# Patient Record
Sex: Male | Born: 1990 | Race: Asian | Hispanic: No | Marital: Single | Smoking: Never smoker
Health system: Southern US, Community
[De-identification: ages and names within clinical notes are randomized; demographics above are authoritative.]

---

## 2013-01-11 ENCOUNTER — Emergency Department (HOSPITAL_COMMUNITY)
Admission: EM | Admit: 2013-01-11 | Discharge: 2013-01-12 | Disposition: A | Discharge status: Home / Self Care | Payer: BC Managed Care – PPO | Attending: Emergency Medicine | Admitting: Emergency Medicine

## 2013-01-11 ENCOUNTER — Encounter (HOSPITAL_COMMUNITY): Payer: Self-pay | Admitting: Emergency Medicine

## 2013-01-11 DIAGNOSIS — Y9239 Other specified sports and athletic area as the place of occurrence of the external cause: Secondary | ICD-10-CM | POA: Insufficient documentation

## 2013-01-11 DIAGNOSIS — S42001A Fracture of unspecified part of right clavicle, initial encounter for closed fracture: Secondary | ICD-10-CM

## 2013-01-11 DIAGNOSIS — S42009A Fracture of unspecified part of unspecified clavicle, initial encounter for closed fracture: Secondary | ICD-10-CM | POA: Insufficient documentation

## 2013-01-11 DIAGNOSIS — R296 Repeated falls: Secondary | ICD-10-CM | POA: Insufficient documentation

## 2013-01-11 DIAGNOSIS — Y9361 Activity, american tackle football: Secondary | ICD-10-CM | POA: Insufficient documentation

## 2013-01-11 NOTE — ED Notes (Signed)
Patient states he was playing football and landed on right shoulder, now with pain and unable to move shoulder.

## 2013-01-12 ENCOUNTER — Emergency Department (HOSPITAL_COMMUNITY): Payer: BC Managed Care – PPO

## 2013-01-12 MED ORDER — IBUPROFEN 800 MG PO TABS: 800.0000 mg | ORAL_TABLET | Freq: Three times a day (TID) | ORAL | Status: AC | PRN

## 2013-01-12 MED ORDER — HYDROCODONE-ACETAMINOPHEN 5-325 MG PO TABS
1.0000 | ORAL_TABLET | Freq: Once | ORAL | Status: AC
  Administered 2013-01-12: 1 via ORAL
  Filled 2013-01-12: qty 1

## 2013-01-12 MED ORDER — HYDROCODONE-ACETAMINOPHEN 5-325 MG PO TABS: 1.0000 | ORAL_TABLET | ORAL | Status: AC | PRN

## 2013-01-12 NOTE — ED Notes (Signed)
Peter, PA at bedside.

## 2013-01-12 NOTE — ED Provider Notes (Signed)
Medical screening examination/treatment/procedure(s) were performed by non-physician practitioner and as supervising physician I was immediately available for consultation/collaboration.    Rage Beever, MD 01/12/13 0604 

## 2013-01-12 NOTE — ED Notes (Signed)
Swelling and bruising noted to pts right shoulder.

## 2013-01-12 NOTE — ED Notes (Signed)
Pt reports he hit head on ground, but denies LOC. A&Ox4. No neuro deficits noted at this time.

## 2013-01-12 NOTE — ED Provider Notes (Signed)
CSN: [id number]     Arrival date & time 01/11/13  2352 History   First MD Initiated Contact with Patient 01/12/13 0002     Chief Complaint  Patient presents with  . Fall  . Shoulder Injury   HPI  History provided by the patient. Patient is a 22 year old male with no significant PMH who presents with right shoulder pain and injury. Patient reports playing tackle football with friends and landed directly on his right shoulder area. He immediately had sharp pains to his shoulder worse with any movements. Try to rest his arm at home but has not had any improvement in symptoms. He denies any weakness or numbness to the hand. He has not used any medications to help with treatment. Denies any other injury. There was no head injury or LOC. No other associated symptoms.    History reviewed. No pertinent past medical history. History reviewed. No pertinent past surgical history. History reviewed. No pertinent family history. History  Substance Use Topics  . Smoking status: Never Smoker   . Smokeless tobacco: Not on file  . Alcohol Use: No    Review of Systems  Neurological: Negative for weakness and numbness.  All other systems reviewed and are negative.    Allergies  Review of patient's allergies indicates no known allergies.  Home Medications  No current outpatient prescriptions on file. BP 123/71  Pulse 73  Temp(Src) 97.8 F (36.6 C) (Oral)  Resp 18  Wt 152 lb 1 oz (68.975 kg)  SpO2 99% Physical Exam  Nursing note and vitals reviewed. Constitutional: He is oriented to person, place, and time. He appears well-developed and well-nourished. No distress.  HENT:  Head: Normocephalic and atraumatic.  Neck: Normal range of motion. Neck supple.  Cardiovascular: Normal rate and regular rhythm.   Pulmonary/Chest: Effort normal and breath sounds normal. No respiratory distress. He has no wheezes. He has no rales.  Musculoskeletal: He exhibits edema and tenderness.  Reduced range of  motion of the right shoulder secondary to pain. There is pain and swelling over the distal right clavicle area with slight deformity.   Neurovascularly intact distally with normal grip strength sensations and pulses.  Neurological: He is alert and oriented to person, place, and time.  Skin: Skin is warm.  Psychiatric: He has a normal mood and affect. His behavior is normal.    ED Course  Procedures   Patient seen and evaluated. Patient appears uncomfortable. There is pain and swelling with slight deformity of the right clavicle area. Patient is neurovascularly intact distally.  X-rays reviewed with the patient and family in the room. Discussed findings of clavicle fracture and treatment. Patient and family agrees with plan.  Imaging Review Dg Clavicle Right  01/12/2013   EXAM: RIGHT CLAVICLE - 2+ VIEWS  COMPARISON:  None.  FINDINGS: There is an angulated segmental fracture of the mid to distal shaft of the clavicle. The distal 3rd of the clavicle and AC joint appear within normal limits. Severe displacement of the distal clavicle is 1/2 shaft width.  IMPRESSION: "Zed" fracture of the mid to distal right clavicle.   Electronically Signed   By: Andreas Newport M.D.   On: 01/12/2013 01:13   Dg Shoulder Right  01/12/2013   CLINICAL DATA:  Right shoulder injury. Football injury.  EXAM: RIGHT SHOULDER - 2+ VIEW  COMPARISON:  None.  FINDINGS: There is an angulated segmental fracture of the mid to distal shaft of the clavicle. The distal 3rd of the clavicle and AC  joint appear within normal limits. Humerus appears normal and the shoulder is located.  IMPRESSION: "Zed" fracture of the mid to distal right clavicle.   Electronically Signed   By: Andreas Newport M.D.   On: 01/12/2013 01:13     MDM   1. Right clavicle fracture, closed, initial encounter        Angus Seller, PA-C 01/12/13 0123

## 2013-01-12 NOTE — Discharge Instructions (Signed)
You were seen and evaluated for yourshoulder pain and injury. Your x-rays today showed a fracture of your right collarbone. You have been given a sling to help rest your injury and to help with healing. Please followup with a primary care provider or orthopedics specialist for continued evaluation and treatment.   Clavicle Fracture A clavicle fracture is a break in the collarbone. This is a common injury, especially in children. Collarbones do not harden until around the age of 9. Most collarbone fractures are treated with a simple arm sling. In some cases a figure-of-eight splint is used to help hold the broken bones in position. Although not often needed, surgery may be required if the bone fragments are not in the correct position (displaced).  HOME CARE INSTRUCTIONS   Apply ice to the injury for 15-20 minutes each hour while awake for 2 days. Put the ice in a plastic bag and place a towel between the bag of ice and your skin.  Wear the sling or splint constantly for as long as directed by your caregiver. You may remove the sling or splint for bathing or showering. Be sure to keep your shoulder in the same place as when the sling or splint is on. Do not lift your arm.  If a figure-of-eight splint is applied, it must be tightened by another person every day. Tighten it enough to keep the shoulders held back. Allow enough room to place the index finger between the body and strap. Loosen the splint immediately if you feel numbness or tingling in your hands.  Only take over-the-counter or prescription medicines for pain, discomfort, or fever as directed by your caregiver.  Avoid activities that irritate or increase the pain for 4 to 6 weeks after surgery.  Follow all instructions for follow-up with your caregiver. This includes any referrals, physical therapy, and rehabilitation. Any delay in obtaining necessary care could result in a delay or failure of the injury to heal properly. SEEK MEDICAL  CARE IF:  You have pain and swelling that are not relieved with medications. SEEK IMMEDIATE MEDICAL CARE IF:  Your arm is numb, cold, or pale, even when the splint is loose. MAKE SURE YOU:   Understand these instructions.  Will watch your condition.  Will get help right away if you are not doing well or get worse. Document Released: 12/15/2004 Document Revised: 05/30/2011 Document Reviewed: 10/11/2007 [facility] Patient Information 2014 [place], [place].

## 2014-03-11 ENCOUNTER — Other Ambulatory Visit: Payer: Self-pay | Admitting: Family Medicine

## 2014-03-11 ENCOUNTER — Ambulatory Visit
Admission: RE | Admit: 2014-03-11 | Discharge: 2014-03-11 | Disposition: A | Discharge status: Home / Self Care | Payer: BC Managed Care – PPO | Source: Ambulatory Visit | Attending: Family Medicine | Admitting: Family Medicine

## 2014-03-11 DIAGNOSIS — Z227 Latent tuberculosis: Secondary | ICD-10-CM

## 2015-07-22 ENCOUNTER — Other Ambulatory Visit: Payer: Self-pay | Admitting: Family Medicine

## 2015-07-22 ENCOUNTER — Ambulatory Visit
Admission: RE | Admit: 2015-07-22 | Discharge: 2015-07-22 | Disposition: A | Discharge status: Home / Self Care | Payer: BLUE CROSS/BLUE SHIELD | Source: Ambulatory Visit | Attending: Family Medicine | Admitting: Family Medicine

## 2015-07-22 DIAGNOSIS — S0990XA Unspecified injury of head, initial encounter: Secondary | ICD-10-CM

## 2016-04-11 DIAGNOSIS — S99912A Unspecified injury of left ankle, initial encounter: Secondary | ICD-10-CM | POA: Diagnosis not present

## 2017-02-11 IMAGING — CT CT HEAD W/O CM
3 of 4 series · 16 of 47 positions shown, 19 images · non-contrast
Comparison: None.

CLINICAL DATA: Fell while playing basketball 07/17/2015. Hit head.
Persistent headache.

EXAM:
CT HEAD WITHOUT CONTRAST
TECHNIQUE: Contiguous axial images were obtained from the base of the skull
through the vertex without intravenous contrast.

[Series 32: 3d filtered head w/o · axial · non-contrast · 0.49mm/px · z∈[-8,+112]mm · 10 of 28 slices shown, 13 images]
[im 2/28  brain]
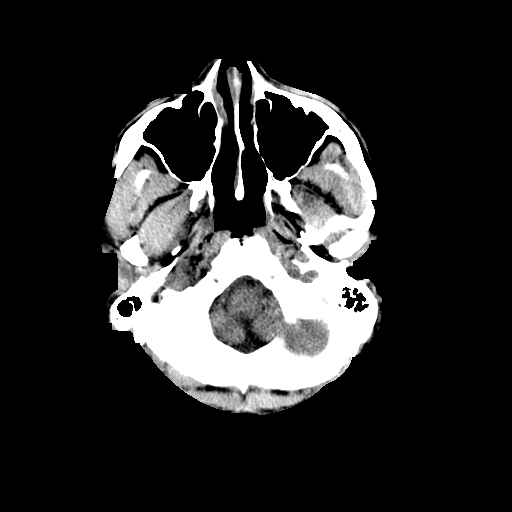
[im 2/28  bone]
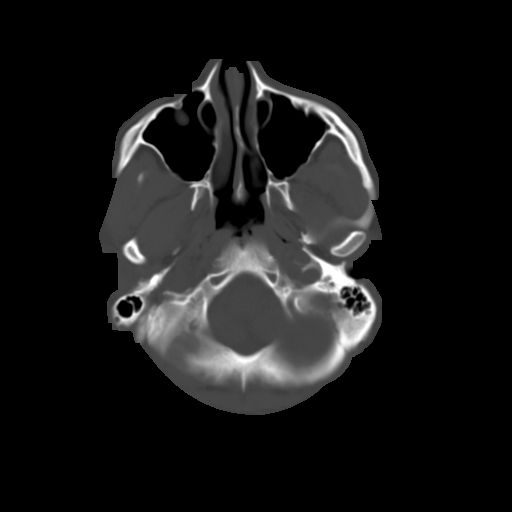
[im 4/28  brain]
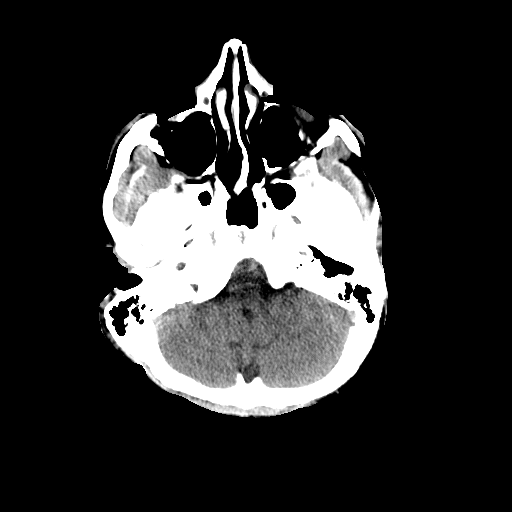
[im 8/28  brain]
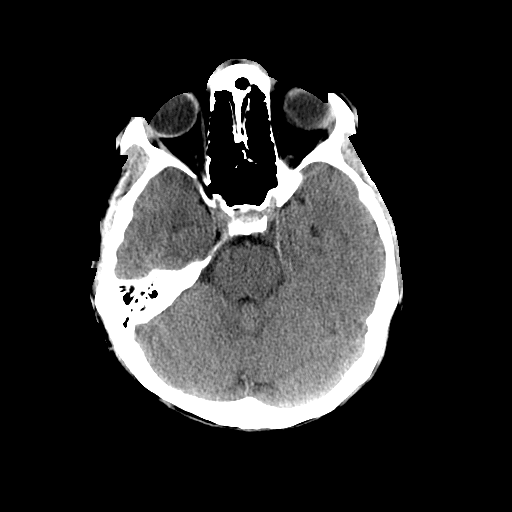
[im 10/28  brain]
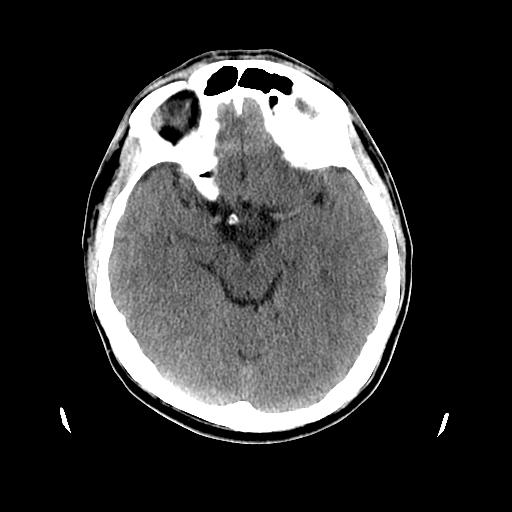
[im 12/28  brain]
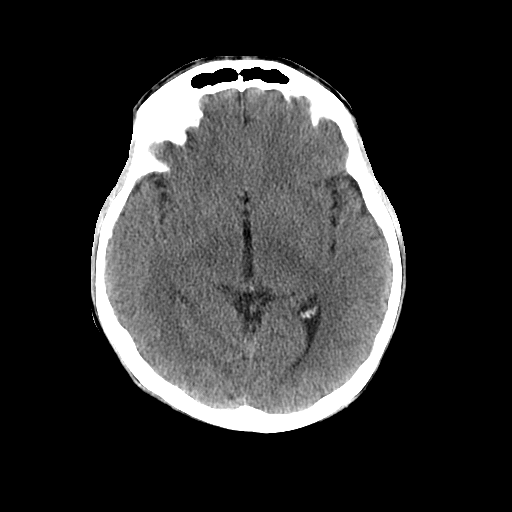
[im 12/28  bone]
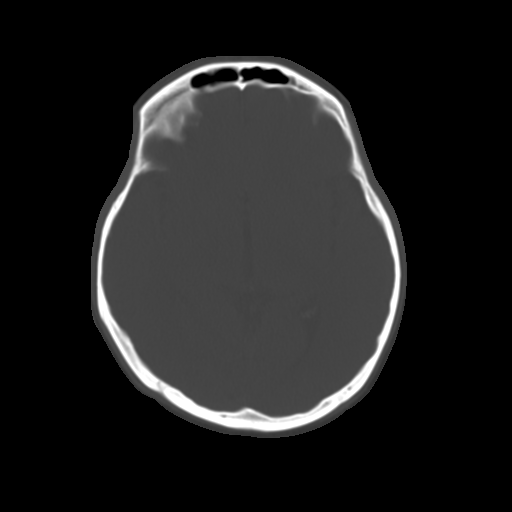
[im 16/28  brain]
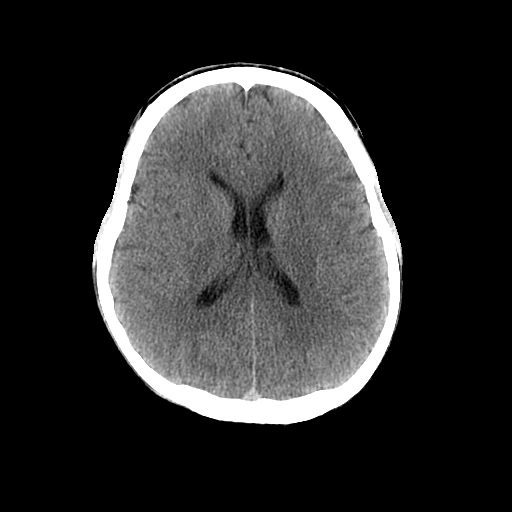
[im 18/28  brain]
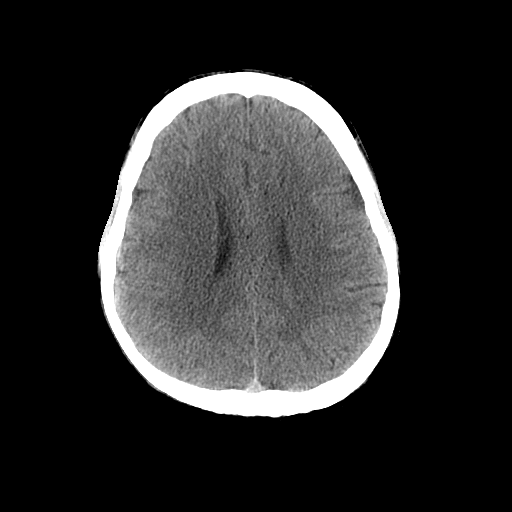
[im 20/28  brain]
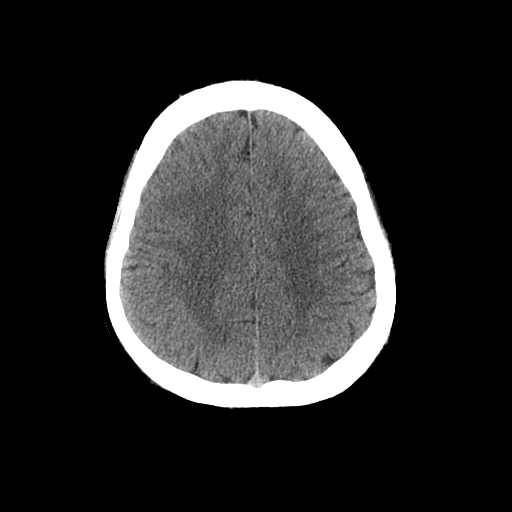
[im 24/28  brain]
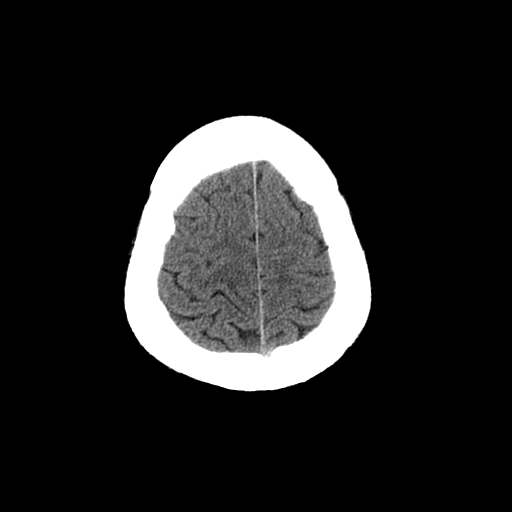
[im 24/28  bone]
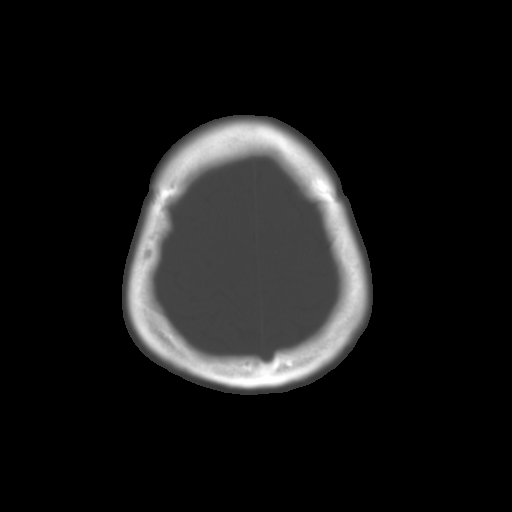
[im 26/28  brain]
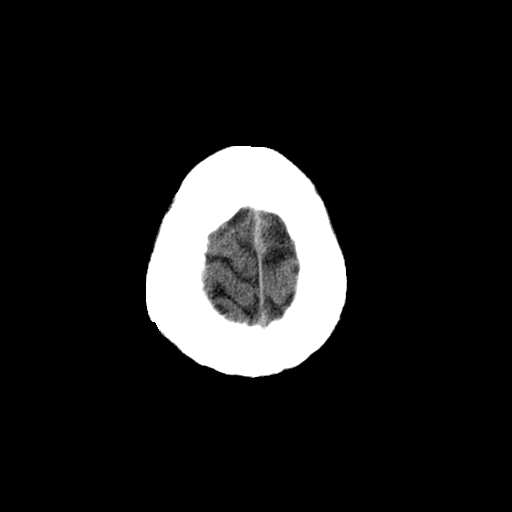

[Series 601: coronal brain · coronal · 0.49mm/px · 3 of 71 slices shown]
[im 24/71  brain]
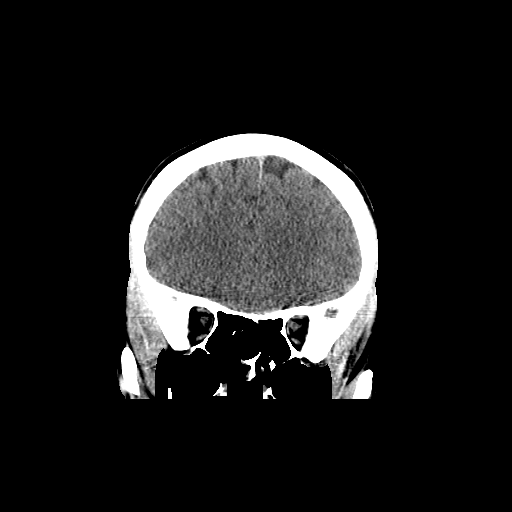
[im 32/71  brain]
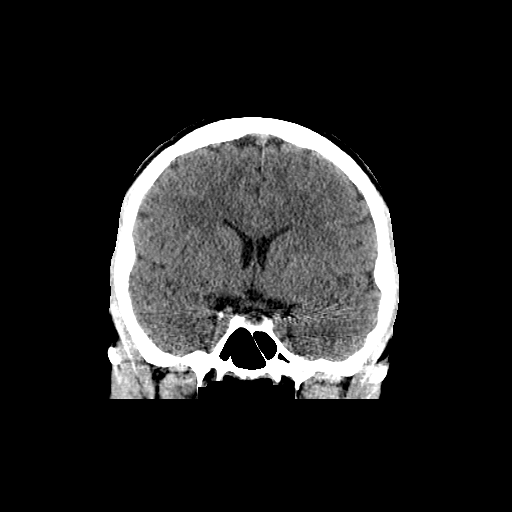
[im 39/71  brain]
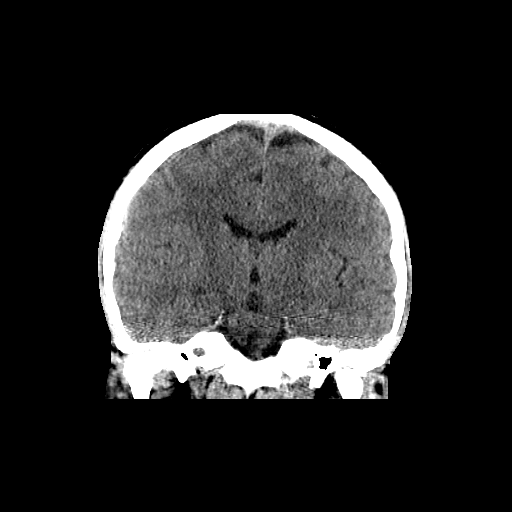

[Series 602: sagittal brain · sagittal · 0.49mm/px · 3 of 63 slices shown]
[im 21/63  brain]
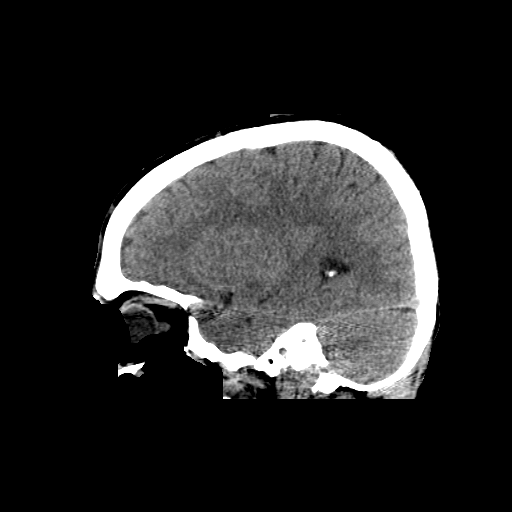
[im 32/63  brain]
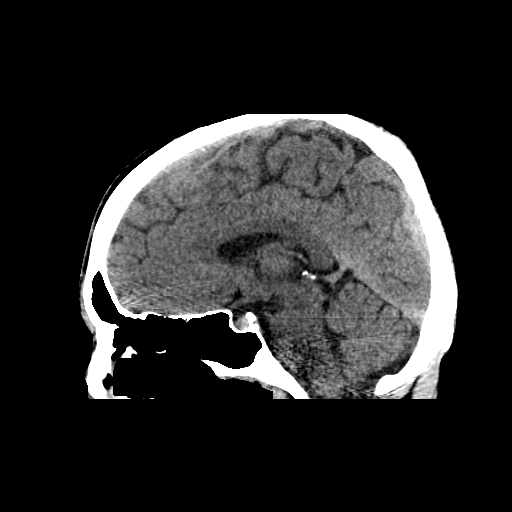
[im 42/63  brain]
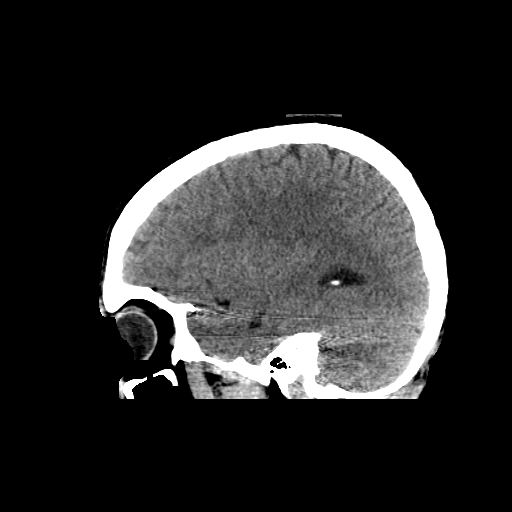

[16 of 47 positions shown; findings below may reference images not displayed]

FINDINGS: The ventricles are normal in size and configuration. No extra-axial
fluid collections are identified. The gray-white differentiation is
normal. No CT findings for acute intracranial process such as
hemorrhage or infarction. No mass lesions. The brainstem and
cerebellum are grossly normal.

The bony structures are intact. The paranasal sinuses and mastoid
air cells are clear. The globes are intact.
IMPRESSION: Normal head CT

## 2017-05-08 DIAGNOSIS — Z23 Encounter for immunization: Secondary | ICD-10-CM | POA: Diagnosis not present

## 2017-12-21 DIAGNOSIS — S93402A Sprain of unspecified ligament of left ankle, initial encounter: Secondary | ICD-10-CM | POA: Diagnosis not present

## 2019-04-07 DIAGNOSIS — Z03818 Encounter for observation for suspected exposure to other biological agents ruled out: Secondary | ICD-10-CM | POA: Diagnosis not present

## 2019-04-07 DIAGNOSIS — Z20828 Contact with and (suspected) exposure to other viral communicable diseases: Secondary | ICD-10-CM | POA: Diagnosis not present

## 2019-06-13 ENCOUNTER — Ambulatory Visit: Payer: Self-pay | Attending: Internal Medicine

## 2019-06-13 DIAGNOSIS — Z23 Encounter for immunization: Secondary | ICD-10-CM

## 2019-06-13 NOTE — Progress Notes (Signed)
   Covid-19 Vaccination Clinic  Name:  Kamdyn Colborn    MRN: [id number] DOB: [birth year]  06/13/2019  Mr. Cornwall was observed post Covid-19 immunization for 15 minutes without incident. He was provided with Vaccine Information Sheet and instruction to access the V-Safe system.   Mr. Ravi was instructed to call 911 with any severe reactions post vaccine: Marland Kitchen Difficulty breathing  . Swelling of face and throat  . A fast heartbeat  . A bad rash all over body  . Dizziness and weakness   Immunizations Administered    Name Date Dose VIS Date Route   Pfizer COVID-19 Vaccine 06/13/2019 11:42 AM 0.3 mL 03/01/2019 Intramuscular   Manufacturer: [employer], [employer]   Lot: [id number]   NDC: [address]-9

## 2019-07-08 ENCOUNTER — Ambulatory Visit: Payer: Self-pay | Attending: Internal Medicine

## 2019-07-08 DIAGNOSIS — Z23 Encounter for immunization: Secondary | ICD-10-CM

## 2019-07-08 NOTE — Progress Notes (Signed)
   Covid-19 Vaccination Clinic  Name:  [name]    MRN: [id number] DOB: May 08, 1990  07/08/2019  Mr. Mauritz was observed post Covid-19 immunization for 15 minutes without incident. He was provided with Vaccine Information Sheet and instruction to access the V-Safe system.   Mr. Colson was instructed to call 911 with any severe reactions post vaccine: Marland Kitchen Difficulty breathing  . Swelling of face and throat  . A fast heartbeat  . A bad rash all over body  . Dizziness and weakness   Immunizations Administered    Name Date Dose VIS Date Route   Pfizer COVID-19 Vaccine 07/08/2019  1:03 PM 0.3 mL 05/15/2018 Intramuscular   Manufacturer: [employer], [employer]   Lot: [id number]   NDC: [address]-1

## 2019-08-14 DIAGNOSIS — Z20828 Contact with and (suspected) exposure to other viral communicable diseases: Secondary | ICD-10-CM | POA: Diagnosis not present

## 2019-10-01 DIAGNOSIS — Z20822 Contact with and (suspected) exposure to covid-19: Secondary | ICD-10-CM | POA: Diagnosis not present

## 2019-10-01 DIAGNOSIS — Z03818 Encounter for observation for suspected exposure to other biological agents ruled out: Secondary | ICD-10-CM | POA: Diagnosis not present

## 2020-01-07 DIAGNOSIS — Z20822 Contact with and (suspected) exposure to covid-19: Secondary | ICD-10-CM | POA: Diagnosis not present

## 2020-10-26 DIAGNOSIS — Z0000 Encounter for general adult medical examination without abnormal findings: Secondary | ICD-10-CM | POA: Diagnosis not present

## 2020-10-26 DIAGNOSIS — E782 Mixed hyperlipidemia: Secondary | ICD-10-CM | POA: Diagnosis not present

## 2022-03-23 DIAGNOSIS — J029 Acute pharyngitis, unspecified: Secondary | ICD-10-CM | POA: Diagnosis not present

## 2022-03-23 DIAGNOSIS — R059 Cough, unspecified: Secondary | ICD-10-CM | POA: Diagnosis not present

## 2022-05-12 DIAGNOSIS — Z03818 Encounter for observation for suspected exposure to other biological agents ruled out: Secondary | ICD-10-CM | POA: Diagnosis not present

## 2022-05-12 DIAGNOSIS — L853 Xerosis cutis: Secondary | ICD-10-CM | POA: Diagnosis not present

## 2022-05-12 DIAGNOSIS — R051 Acute cough: Secondary | ICD-10-CM | POA: Diagnosis not present

## 2023-01-13 DIAGNOSIS — Z13220 Encounter for screening for lipoid disorders: Secondary | ICD-10-CM | POA: Diagnosis not present

## 2023-01-13 DIAGNOSIS — G4700 Insomnia, unspecified: Secondary | ICD-10-CM | POA: Diagnosis not present

## 2023-01-13 DIAGNOSIS — Z0000 Encounter for general adult medical examination without abnormal findings: Secondary | ICD-10-CM | POA: Diagnosis not present

## 2023-04-18 DIAGNOSIS — R051 Acute cough: Secondary | ICD-10-CM | POA: Diagnosis not present

## 2023-04-18 DIAGNOSIS — R52 Pain, unspecified: Secondary | ICD-10-CM | POA: Diagnosis not present

## 2023-04-18 DIAGNOSIS — Z6828 Body mass index (BMI) 28.0-28.9, adult: Secondary | ICD-10-CM | POA: Diagnosis not present

## 2023-06-26 DIAGNOSIS — M25511 Pain in right shoulder: Secondary | ICD-10-CM | POA: Diagnosis not present

## 2023-06-26 DIAGNOSIS — R350 Frequency of micturition: Secondary | ICD-10-CM | POA: Diagnosis not present

## 2023-06-26 DIAGNOSIS — Z6829 Body mass index (BMI) 29.0-29.9, adult: Secondary | ICD-10-CM | POA: Diagnosis not present

## 2023-09-04 DIAGNOSIS — M25512 Pain in left shoulder: Secondary | ICD-10-CM | POA: Diagnosis not present

## 2023-09-20 DIAGNOSIS — M25511 Pain in right shoulder: Secondary | ICD-10-CM | POA: Diagnosis not present

## 2023-09-26 DIAGNOSIS — M25511 Pain in right shoulder: Secondary | ICD-10-CM | POA: Diagnosis not present

## 2023-09-28 DIAGNOSIS — M25511 Pain in right shoulder: Secondary | ICD-10-CM | POA: Diagnosis not present

## 2023-10-03 DIAGNOSIS — M25511 Pain in right shoulder: Secondary | ICD-10-CM | POA: Diagnosis not present

## 2023-10-05 DIAGNOSIS — M25511 Pain in right shoulder: Secondary | ICD-10-CM | POA: Diagnosis not present

## 2023-10-24 DIAGNOSIS — M25511 Pain in right shoulder: Secondary | ICD-10-CM | POA: Diagnosis not present
